# Patient Record
Sex: Female | Born: 1990 | Race: White | Hispanic: No | Marital: Single | Smoking: Current every day smoker
Health system: Southern US, Community
[De-identification: ages and names within clinical notes are randomized; demographics above are authoritative.]

---
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
## 2011-04-27 NOTE — Discharge Summary (Signed)
[name], [name]                 ACCOUNT NO.:  [id number]   MEDICAL RECORD NO.:  [id number]          PATIENT TYPE:  INP   LOCATION:  9169                          FACILITY:  [facility]   PHYSICIAN:  Lenoard Aden, M.D.DATE OF BIRTH:  [birth year]   DATE OF ADMISSION:  04/06/2008  DATE OF DISCHARGE:                               DISCHARGE SUMMARY   CHIEF COMPLAINT:  Spontaneous rupture of membranes at term.   HISTORY OF PRESENT ILLNESS:  She is an 20 year old white female gravida  1, para 0 at 40-6/7 weeks' gestation with spontaneous rupture of  membranes, clear fluid, group B strep positive, and mildly elevated  blood pressures.   ALLERGIES:  No known drug allergies.   MEDICATIONS:  Prenatal vitamins.   PAST MEDICAL AND SURGICAL HISTORY:  She has a history of wrist surgery,  migraine headaches, reflux, and CMV a year ago.   FAMILY HISTORY:  She has a family history which is remarkable for breast  cancer, chronic hypertension, and diabetes.   SOCIAL HISTORY:  She is a nondrinker.  She is a reformed smoker.  She  denies domestic or physical violence.   PRENATAL COURSE TO DATE:  Complicated by GBS positivity but otherwise  uncomplicated.   PHYSICAL EXAMINATION:  GENERAL:  A well-developed, well-nourished white  female, in no acute distress.  HEENT: Normal.  LUNGS: Clear.  HEART: Regular rhythm.  ABDOMEN: Soft, gravid, and nontender.  Estimated fetal weight 7-1/2  pounds.  Cervix is 1 cm, 50% vertex, -1 to -2.  EXTREMITIES: There are no cords.  NEUROLOGIC: Nonfocal.  SKIN: Intact.   IMPRESSION:  1. Intrauterine pregnancy at 40-6/7 weeks.  2. Mildly elevated blood pressure.  3. Group B streptococcus positive.   PLAN:  Check PIH panel.  Start low-dose Pitocin, epidural as needed,  anticipated attempts at vaginal delivery.      Lenoard Aden, M.D.  Electronically Signed     RJT/MEDQ  D:  04/06/2008  T:  04/07/2008  Job:  [id number]

## 2011-04-30 NOTE — Op Note (Signed)
NAMEZAIYA, ANNUNZIATO                 ACCOUNT NO.:  [id number]   MEDICAL RECORD NO.:  [id number]          PATIENT TYPE:  AMB   LOCATION:  DSC                          FACILITY:  [facility]   PHYSICIAN:  Artist Pais. Weingold, M.D.DATE OF BIRTH:  August 10, 1991   DATE OF PROCEDURE:  11/25/2004  DATE OF DISCHARGE:                                 OPERATIVE REPORT   PREOPERATIVE DIAGNOSIS:  Right wrist ulnar side pain.   POSTOPERATIVE DIAGNOSIS:  Right wrist ulnar side pain.   PROCEDURES:  Right wrist arthroscopy with debridement of lunotriquetral  interosseus ligament.   SURGEON:  Artist Pais. Mina Marble, M.D.   ASSISTANT:  Aura Fey. Bobbe Medico.   ANESTHESIA:  General.   TOURNIQUET TIME:  35 minutes.   COMPLICATIONS:  None.   DRAINS:  None.   OPERATIVE REPORT:  The patient was taken to the operating room.  After the  induction of adequate general anesthesia, the right upper extremity was  prepped and draped in sterile fashion.  An esmarch was used exsanguinate the  limb.  The tourniquet was inflated to 250 mmHg at this point.  The patient's  right upper extremity was padded and placed in the wrist traction tower with  12 pounds of countertraction across the radial carpal joint.  A standard 3-4  arthroscopic portal was established 1 cm distal to Lister's tubercle.  The  incision was taken down through the skin and subcutaneous tissues.  Blunt  dissection was carried down through the capsule.  The scope was introduced  into the joint.  An initial mid carpal portal was established.  The mid  carpal joint looked fine.  The radial carpal portal was established.  Visualization revealed intact radial side ligaments, intact _volar_________  ligaments, partially torn LT ligament and some inflammation around the TFCC.  A 6U alpha port was established under direct vision using an 18 gauge needle  followed by a 4-5 working portal.  The TFC was stable throughout its entire  length and was probed throughout  the entire circumference.  After this was  done, a significant amount of ulnar sided synovitis was debrided using the  suction shaver and 1.5 mm ArthroCare wand, including a partially torn LT  ligament.  This was debrided down to a stable rim.  The instruments were  removed from the portals.  The portals were injected with Marcaine and then  loosely closed with 5-0 nylon.  A sterile dressing of Xeroform, 4 x 4s and a  volar splint was applied.  The patient tolerated the procedure well and went  to recovery in stable fashion.     Matt  MAW/MEDQ  D:  11/25/2004  T:  11/25/2004  Job:  [id number]

## 2011-09-07 LAB — COMPREHENSIVE METABOLIC PANEL
ALT: 16
ALT: 16
ALT: 12
AST: 15
AST: 16
AST: 17
Albumin: 2.5 — ABNORMAL LOW
Albumin: 2.7 — ABNORMAL LOW
Albumin: 2.7 — ABNORMAL LOW
Alkaline Phosphatase: 112
Alkaline Phosphatase: 122 — ABNORMAL HIGH
Alkaline Phosphatase: 140 — ABNORMAL HIGH
BUN: 1 — ABNORMAL LOW
BUN: 4 — ABNORMAL LOW
BUN: 4 — ABNORMAL LOW
CO2: 23
CO2: 23
CO2: 20
Calcium: 9
Calcium: 9
Calcium: 9
Chloride: 103
Chloride: 107
Chloride: 107
Creatinine, Ser: 0.5
Creatinine, Ser: 0.58
Creatinine, Ser: 0.55
GFR calc Af Amer: >60
GFR calc Af Amer: >60
GFR calc non Af Amer: >60
GFR calc non Af Amer: >60
Glucose, Bld: 102 — ABNORMAL HIGH
Glucose, Bld: 130 — ABNORMAL HIGH
Glucose, Bld: 106 — ABNORMAL HIGH
Potassium: 3.3 — ABNORMAL LOW
Potassium: 3.4 — ABNORMAL LOW
Potassium: 3.5
Sodium: 136
Sodium: 137
Sodium: 137
Total Bilirubin: 0.4
Total Bilirubin: 0.6
Total Bilirubin: 0.5
Total Protein: 5.5 — ABNORMAL LOW
Total Protein: 6
Total Protein: 6

## 2011-09-07 LAB — CBC
HCT: 30.8 — ABNORMAL LOW
HCT: 32.3 — ABNORMAL LOW
HCT: 28 — ABNORMAL LOW
HCT: 34 — ABNORMAL LOW
Hemoglobin: 10.8 — ABNORMAL LOW
Hemoglobin: 11.3 — ABNORMAL LOW
Hemoglobin: 11.8 — ABNORMAL LOW
Hemoglobin: 9.7 — ABNORMAL LOW
MCHC: 34.9
MCHC: 35.2
MCHC: 34.6
MCHC: 34.7
MCV: 88.2
MCV: 88.9
MCV: 89.6
MCV: 89.8
Platelets: 161
Platelets: 195
Platelets: 162
Platelets: 183
RBC: 3.48 — ABNORMAL LOW
RBC: 3.64 — ABNORMAL LOW
RBC: 3.13 — ABNORMAL LOW
RBC: 3.79 — ABNORMAL LOW
RDW: 14.9
RDW: 15.2
RDW: 14.7
RDW: 14.9
WBC: 12.6 — ABNORMAL HIGH
WBC: 12.7
WBC: 13.3 — ABNORMAL HIGH
WBC: 20.7 — ABNORMAL HIGH

## 2011-09-07 LAB — RH IMMUNE GLOB WKUP(>/=20WKS)(NOT WOMEN'S HOSP): Fetal Screen: NEGATIVE

## 2011-09-07 LAB — URINALYSIS, ROUTINE W REFLEX MICROSCOPIC
Bilirubin Urine: NEGATIVE
Glucose, UA: NEGATIVE
Ketones, ur: NEGATIVE
Nitrite: NEGATIVE
Protein, ur: NEGATIVE
Specific Gravity, Urine: 1.01
Urobilinogen, UA: 0.2
pH: 7

## 2011-09-07 LAB — URIC ACID
Uric Acid, Serum: 4
Uric Acid, Serum: 4.2
Uric Acid, Serum: 5

## 2011-09-07 LAB — URINE MICROSCOPIC-ADD ON

## 2011-09-07 LAB — RPR: RPR Ser Ql: NONREACTIVE

## 2011-09-07 LAB — LACTATE DEHYDROGENASE
LDH: 125
LDH: 142
LDH: 162

## 2014-07-07 ENCOUNTER — Encounter (HOSPITAL_COMMUNITY): Payer: Self-pay | Admitting: Emergency Medicine

## 2014-07-07 ENCOUNTER — Emergency Department (HOSPITAL_COMMUNITY): Payer: 59

## 2014-07-07 ENCOUNTER — Emergency Department (HOSPITAL_COMMUNITY)
Admission: EM | Admit: 2014-07-07 | Discharge: 2014-07-07 | Disposition: A | Discharge status: Home / Self Care | Payer: 59 | Attending: Emergency Medicine | Admitting: Emergency Medicine

## 2014-07-07 DIAGNOSIS — S93402A Sprain of unspecified ligament of left ankle, initial encounter: Secondary | ICD-10-CM

## 2014-07-07 DIAGNOSIS — Y9289 Other specified places as the place of occurrence of the external cause: Secondary | ICD-10-CM | POA: Insufficient documentation

## 2014-07-07 DIAGNOSIS — Y9389 Activity, other specified: Secondary | ICD-10-CM | POA: Diagnosis not present

## 2014-07-07 DIAGNOSIS — S99929A Unspecified injury of unspecified foot, initial encounter: Secondary | ICD-10-CM

## 2014-07-07 DIAGNOSIS — S8990XA Unspecified injury of unspecified lower leg, initial encounter: Secondary | ICD-10-CM | POA: Diagnosis present

## 2014-07-07 DIAGNOSIS — F17200 Nicotine dependence, unspecified, uncomplicated: Secondary | ICD-10-CM | POA: Diagnosis not present

## 2014-07-07 DIAGNOSIS — S99919A Unspecified injury of unspecified ankle, initial encounter: Secondary | ICD-10-CM

## 2014-07-07 DIAGNOSIS — X500XXA Overexertion from strenuous movement or load, initial encounter: Secondary | ICD-10-CM | POA: Diagnosis not present

## 2014-07-07 DIAGNOSIS — S93409A Sprain of unspecified ligament of unspecified ankle, initial encounter: Secondary | ICD-10-CM | POA: Diagnosis not present

## 2014-07-07 MED ORDER — TRAMADOL HCL 50 MG PO TABS: 50.0000 mg | ORAL_TABLET | Freq: Four times a day (QID) | ORAL | Status: AC | PRN

## 2014-07-07 MED ORDER — TRAMADOL HCL 50 MG PO TABS
50.0000 mg | ORAL_TABLET | Freq: Once | ORAL | Status: AC
  Administered 2014-07-07: 50 mg via ORAL
  Filled 2014-07-07: qty 1

## 2014-07-07 NOTE — Discharge Instructions (Signed)
Ankle Sprain °An ankle sprain is an injury to the strong, fibrous tissues (ligaments) that hold the bones of your ankle joint together.  °CAUSES °An ankle sprain is usually caused by a fall or by twisting your ankle. Ankle sprains most commonly occur when you step on the outer edge of your foot, and your ankle turns inward. People who participate in sports are more prone to these types of injuries.  °SYMPTOMS  °· Pain in your ankle. The pain may be present at rest or only when you are trying to stand or walk. °· Swelling. °· Bruising. Bruising may develop immediately or within 1 to 2 days after your injury. °· Difficulty standing or walking, particularly when turning corners or changing directions. °DIAGNOSIS  °Your caregiver will ask you details about your injury and perform a physical exam of your ankle to determine if you have an ankle sprain. During the physical exam, your caregiver will press on and apply pressure to specific areas of your foot and ankle. Your caregiver will try to move your ankle in certain ways. An X-ray exam may be done to be sure a bone was not broken or a ligament did not separate from one of the bones in your ankle (avulsion fracture).  °TREATMENT  °Certain types of braces can help stabilize your ankle. Your caregiver can make a recommendation for this. Your caregiver may recommend the use of medicine for pain. If your sprain is severe, your caregiver may refer you to a surgeon who helps to restore function to parts of your skeletal system (orthopedist) or a physical therapist. °HOME CARE INSTRUCTIONS  °· Apply ice to your injury for 1-2 days or as directed by your caregiver. Applying ice helps to reduce inflammation and pain. °· Put ice in a plastic bag. °· Place a towel between your skin and the bag. °· Leave the ice on for 15-20 minutes at a time, every 2 hours while you are awake. °· Only take over-the-counter or prescription medicines for pain, discomfort, or fever as directed by  your caregiver. °· Elevate your injured ankle above the level of your heart as much as possible for 2-3 days. °· If your caregiver recommends crutches, use them as instructed. Gradually put weight on the affected ankle. Continue to use crutches or a cane until you can walk without feeling pain in your ankle. °· If you have a plaster splint, wear the splint as directed by your caregiver. Do not rest it on anything harder than a pillow for the first 24 hours. Do not put weight on it. Do not get it wet. You may take it off to take a shower or bath. °· You may have been given an elastic bandage to wear around your ankle to provide support. If the elastic bandage is too tight (you have numbness or tingling in your foot or your foot becomes cold and blue), adjust the bandage to make it comfortable. °· If you have an air splint, you may blow more air into it or let air out to make it more comfortable. You may take your splint off at night and before taking a shower or bath. Wiggle your toes in the splint several times per day to decrease swelling. °SEEK MEDICAL CARE IF:  °· You have rapidly increasing bruising or swelling. °· Your toes feel extremely cold or you lose feeling in your foot. °· Your pain is not relieved with medicine. °SEEK IMMEDIATE MEDICAL CARE IF: °· Your toes are numb or blue. °·   You have severe pain that is increasing. °MAKE SURE YOU:  °· Understand these instructions. °· Will watch your condition. °· Will get help right away if you are not doing well or get worse. °Document Released: 11/29/2005 Document Revised: 08/23/2012 Document Reviewed: 12/11/2011 °[employer]® Patient Information ©2015 [employer]. This information is not intended to replace advice given to you by your health care provider. Make sure you discuss any questions you have with your health care provider. ° ° ° °RICE: Routine Care for Injuries °The routine care of many injuries includes Rest, Ice, Compression, and Elevation (RICE). °HOME  CARE INSTRUCTIONS °· Rest is needed to allow your body to heal. Routine activities can usually be resumed when comfortable. Injured tendons and bones can take up to 6 weeks to heal. Tendons are the cord-like structures that attach muscle to bone. °· Ice following an injury helps keep the swelling down and reduces pain. °¨ Put ice in a plastic bag. °¨ Place a towel between your skin and the bag. °¨ Leave the ice on for 15-20 minutes, 3-4 times a day, or as directed by your health care provider. Do this while awake, for the first 24 to 48 hours. After that, continue as directed by your caregiver. °· Compression helps keep swelling down. It also gives support and helps with discomfort. If an elastic bandage has been applied, it should be removed and reapplied every 3 to 4 hours. It should not be applied tightly, but firmly enough to keep swelling down. Watch fingers or toes for swelling, bluish discoloration, coldness, numbness, or excessive pain. If any of these problems occur, remove the bandage and reapply loosely. Contact your caregiver if these problems continue. °· Elevation helps reduce swelling and decreases pain. With extremities, such as the arms, hands, legs, and feet, the injured area should be placed near or above the level of the heart, if possible. °SEEK IMMEDIATE MEDICAL CARE IF: °· You have persistent pain and swelling. °· You develop redness, numbness, or unexpected weakness. °· Your symptoms are getting worse rather than improving after several days. °These symptoms may indicate that further evaluation or further X-rays are needed. Sometimes, X-rays may not show a small broken bone (fracture) until 1 week or 10 days later. Make a follow-up appointment with your caregiver. Ask when your X-ray results will be ready. Make sure you get your X-ray results. °Document Released: 03/13/2001 Document Revised: 12/04/2013 Document Reviewed: 04/30/2011 °ExitCare® Patient Information ©2015 [employer]. This  information is not intended to replace advice given to you by your health care provider. Make sure you discuss any questions you have with your health care provider. ° °

## 2014-07-07 NOTE — ED Notes (Signed)
Pt has a ride home.  

## 2014-07-07 NOTE — ED Provider Notes (Signed)
CSN: [id number]     Arrival date & time 07/07/14  2100 History  This chart was scribed for Antony MaduraKelly Lexee Brashears, PA-C working with Rolland PorterMark James, MD by Evon Slackerrance Branch, ED Scribe. This patient was seen in room WTR7/WTR7 and the patient's care was started at 10:15 PM.     Chief Complaint  Patient presents with  . Ankle Pain   Patient is a 23 y.o. female presenting with ankle pain. The history is provided by the patient. No language interpreter was used.  Ankle Pain Location:  Ankle Time since incident:  1 day Injury: yes   Ankle location:  L ankle Pain details:    Quality:  Aching   Radiates to:  Does not radiate   Severity:  Moderate   Onset quality:  Sudden   Timing:  Constant   Progression:  Worsening Chronicity:  New Dislocation: no   Prior injury to area:  No Relieved by:  Nothing Worsened by:  Bearing weight, flexion and extension Ineffective treatments:  Ice and NSAIDs Associated symptoms: swelling   Associated symptoms: no fever, no muscle weakness, no numbness, no stiffness and no tingling    HPI Comments: Christine [name] is a 23 y.o. female who presents to the Emergency Department complaining of left ankle pain onset 1 day prior. She states he rolled her ankle last night. She states she has taken ibuprofen with some relief.   History reviewed. No pertinent past medical history. History reviewed. No pertinent past surgical history. No family history on file. History  Substance Use Topics  . Smoking status: Current Every Day Smoker -- 0.50 packs/day    Types: Cigarettes  . Smokeless tobacco: Not on file  . Alcohol Use: Yes     Comment: occasional    OB History   Grav Para Term Preterm Abortions TAB SAB Ect Mult Living                  Review of Systems  Constitutional: Negative for fever.  Musculoskeletal: Positive for arthralgias. Negative for stiffness.    Allergies  Review of patient's allergies indicates no known allergies.  Home Medications   Prior to  Admission medications   Medication Sig Start Date End Date Taking? Authorizing Provider  ibuprofen (ADVIL,MOTRIN) 200 MG tablet Take 400 mg by mouth every 4 (four) hours as needed for moderate pain.   Yes Historical Provider, MD  traMADol (ULTRAM) 50 MG tablet Take 1 tablet (50 mg total) by mouth every 6 (six) hours as needed. 07/07/14   Antony MaduraKelly Avey Mcmanamon, PA-C   Triage Vitals: BP 134/78  Pulse 90  Temp(Src) 98.7 F (37.1 C) (Oral)  Resp 18  Ht 5\' 5"  (1.651 m)  Wt 190 lb (86.183 kg)  BMI 31.62 kg/m2  SpO2 96%  Physical Exam  Nursing note and vitals reviewed. Constitutional: She is oriented to person, place, and time. She appears well-developed and well-nourished. No distress.  HENT:  Head: Normocephalic and atraumatic.  Eyes: Conjunctivae and EOM are normal. No scleral icterus.  Neck: Normal range of motion. Neck supple. No tracheal deviation present.  Cardiovascular: Normal rate, regular rhythm and intact distal pulses.   DP and PT pulses 2+ in left lower extremity  Pulmonary/Chest: Effort normal. No respiratory distress.  Musculoskeletal:  Good passive range of motion with limited active range of motion of left ankle secondary to pain. Moderate swelling around left lateral malleolus with associated tenderness. Mild swelling around medial malleolus. No erythema or heat to touch.  Neurological: She is  alert and oriented to person, place, and time. She exhibits normal muscle tone. Coordination normal.  No gross sensory deficits appreciated. Patient able to wiggle all toes.  Skin: Skin is warm and dry. No rash noted. She is not diaphoretic. No erythema. No pallor.  Capillary refill brisk in LLE  Psychiatric: She has a normal mood and affect. Her behavior is normal.    ED Course  Procedures (including critical care time) DIAGNOSTIC STUDIES: Oxygen Saturation is 96% on RA, adequate by my interpretation.    COORDINATION OF CARE: 10:19 PM-Discussed treatment plan which includes ankle X-ray  with pt at bedside and pt agreed to plan.   Labs Review Labs Reviewed - No data to display  Imaging Review Dg Ankle Complete Left  07/07/2014   CLINICAL DATA:  Twisted ankle.  EXAM: LEFT ANKLE COMPLETE - 3+ VIEW  COMPARISON:  None.  FINDINGS: Slight cortical irregularity of a midfoot tarsal bone seen only on the lateral radiograph (possibly cuboid). No dislocation. The ankle mortise appears congruent and the tibiofibular syndesmosis intact. No destructive bony lesions. Lateral ankle soft tissue swelling without subcutaneous gas or radiopaque foreign bodies.  IMPRESSION: Slight cortical irregularity of the posterior aspect of a tarsal bone (possibly cuboid), recommend correlation with point tenderness. No ankle fracture deformity or dislocation.   Electronically Signed   By: Awilda Metro   On: 07/07/2014 23:02     EKG Interpretation None      MDM   Final diagnoses:  Left ankle sprain, initial encounter    23 year old female presents to the emergency department for left ankle pain and swelling after she rolled her ankle yesterday. Patient neurovascularly intact. No gross sensory deficits appreciated. No evidence of septic joint. No fracture, deformity, or dislocation on x-ray. Patient given ASO ankle. She states she has crutches at home to use. RICE advised and return precautions provided. Patient agreeable to plan with no unaddressed concerns.  I personally performed the services described in this documentation, which was scribed in my presence. The recorded information has been reviewed and is accurate.        Antony Madura, PA-C 07/07/14 2317

## 2014-07-07 NOTE — ED Notes (Signed)
Per pt report: pt stepping down from a chair last night and rolled her left ankle.  Pt reports being unable to bear weight.  Pt a/o x 4. Ankle is swollen.  Pt able to wiggle toes but has limited movement. Pt took ibuprofen for pain this afternoon and it appeared to help.

## 2014-07-16 NOTE — ED Provider Notes (Signed)
Medical screening examination/treatment/procedure(s) were performed by non-physician practitioner and as supervising physician I was immediately available for consultation/collaboration.   EKG Interpretation None        Vita Currin, MD 07/16/14 1520 

## 2016-01-06 IMAGING — CR DG ANKLE COMPLETE 3+V*L*
3 series · 3 of 3 positions shown · non-contrast
Comparison: None.

CLINICAL DATA: Twisted ankle.

EXAM:
LEFT ANKLE COMPLETE - 3+ VIEW

[x ankle ap left]
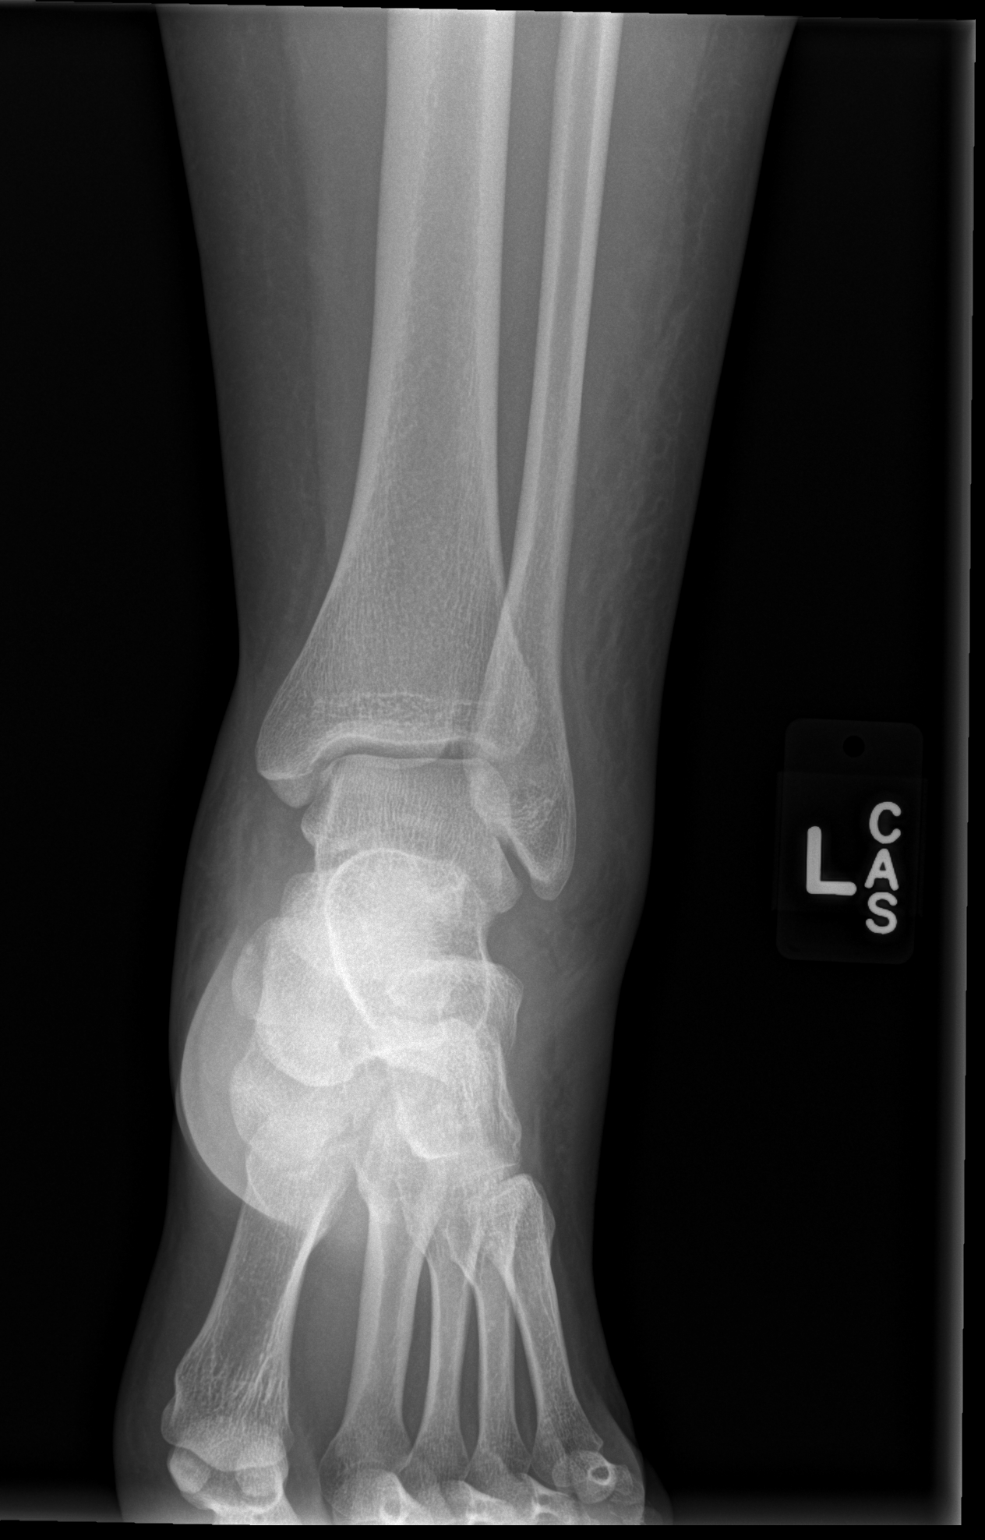

[x ankle obl left]
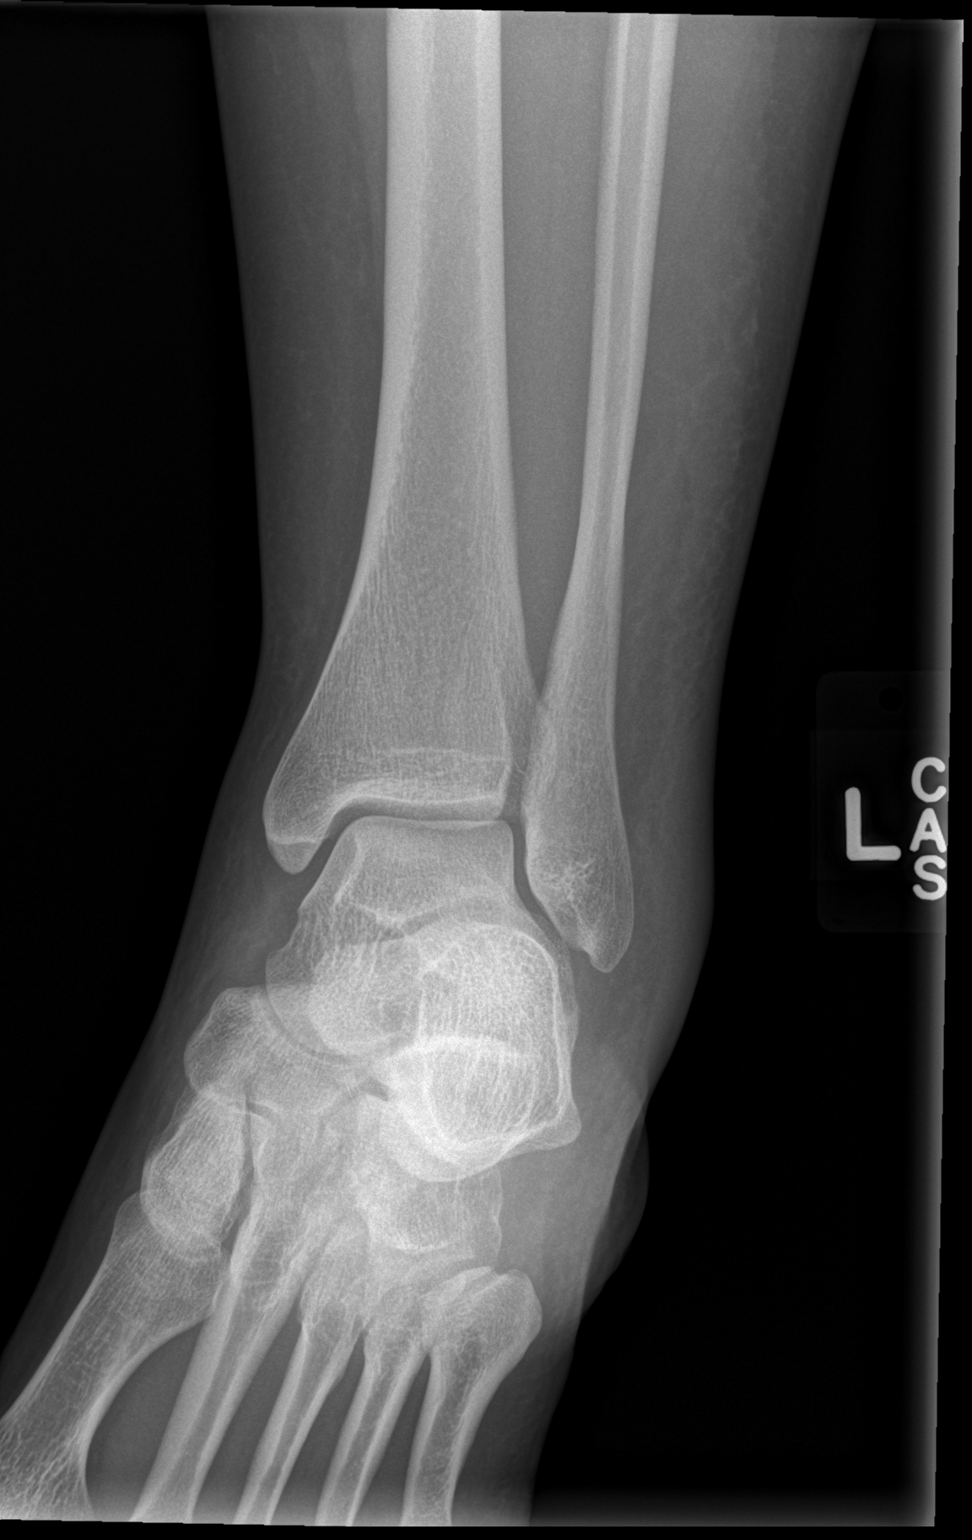

[x ankle lat left]
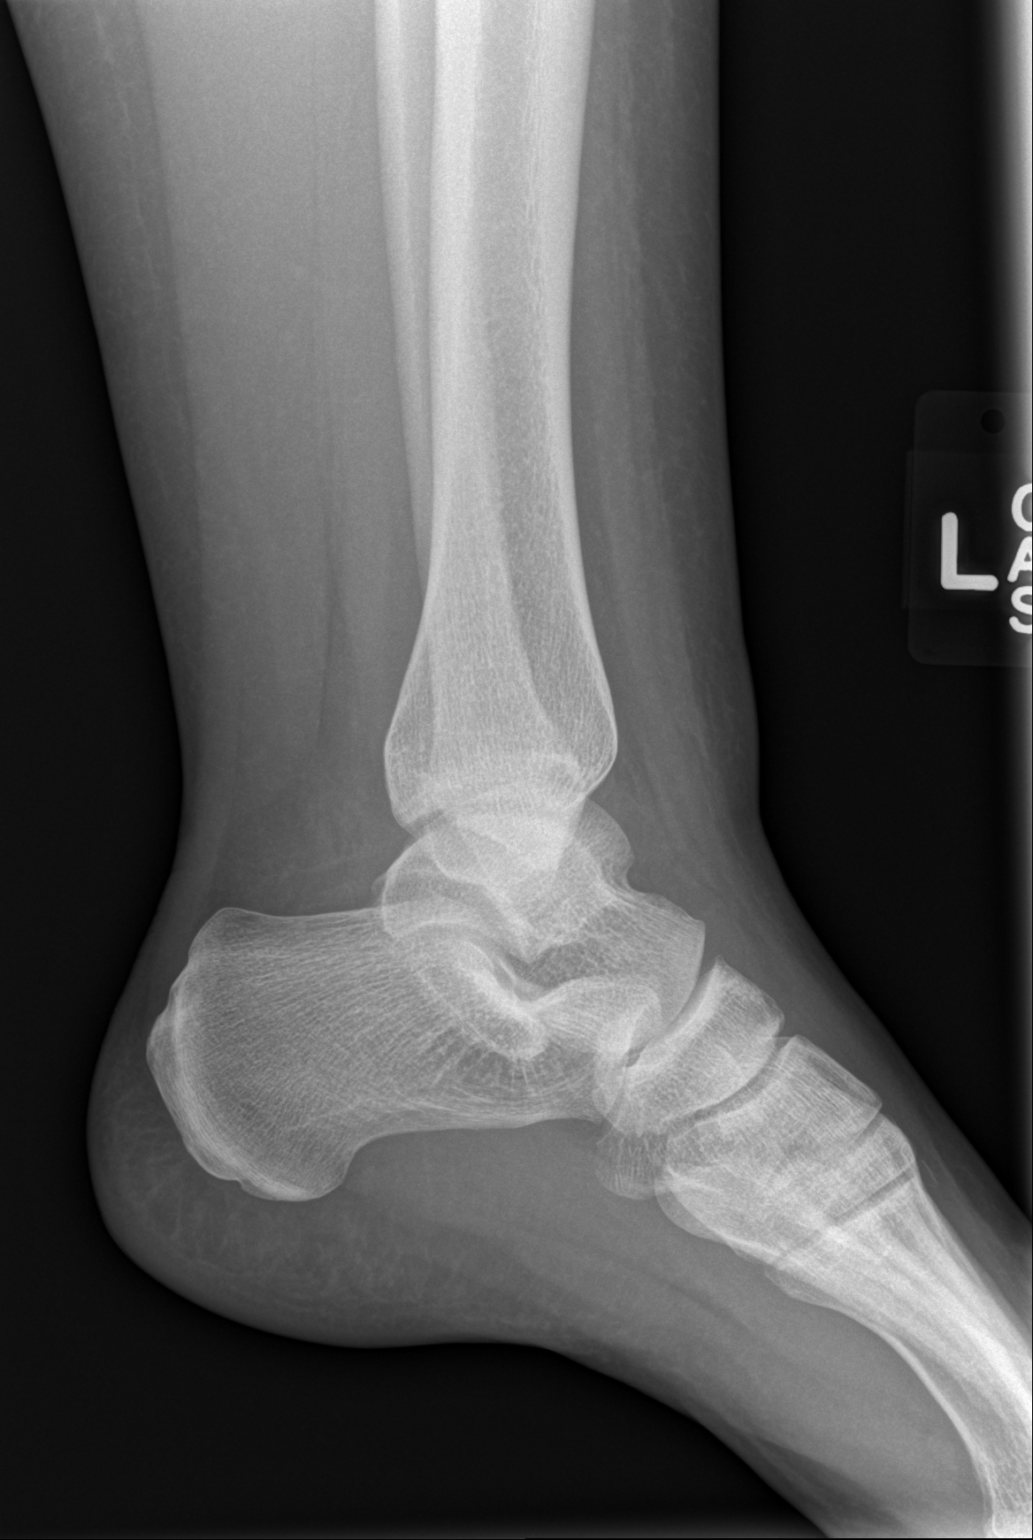

[3 of 3 positions shown; findings below may reference images not displayed]

FINDINGS: Slight cortical irregularity of a midfoot tarsal bone seen only on
the lateral radiograph (possibly cuboid). No dislocation. The ankle
mortise appears congruent and the tibiofibular syndesmosis intact.
No destructive bony lesions. Lateral ankle soft tissue swelling
without subcutaneous gas or radiopaque foreign bodies.
IMPRESSION: Slight cortical irregularity of the posterior aspect of a tarsal
bone (possibly cuboid), recommend correlation with point tenderness.
No ankle fracture deformity or dislocation.

  By: Miroslava Coca
